# Patient Record
Sex: Female | Born: 1985 | Race: White | Hispanic: No
Health system: Southern US, Community
[De-identification: ages and names within clinical notes are randomized; demographics above are authoritative.]

---
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
## 2005-02-01 ENCOUNTER — Other Ambulatory Visit: Admission: RE | Admit: 2005-02-01 | Discharge: 2005-02-01 | Payer: Self-pay | Admitting: Obstetrics and Gynecology

## 2006-03-26 ENCOUNTER — Ambulatory Visit (HOSPITAL_COMMUNITY): Admission: RE | Admit: 2006-03-26 | Discharge: 2006-03-26 | Payer: Self-pay | Admitting: Ophthalmology

## 2006-03-28 ENCOUNTER — Ambulatory Visit (HOSPITAL_COMMUNITY): Admission: RE | Admit: 2006-03-28 | Discharge: 2006-03-28 | Payer: Self-pay | Admitting: Ophthalmology

## 2006-06-27 ENCOUNTER — Ambulatory Visit: Admission: RE | Admit: 2006-06-27 | Discharge: 2006-06-27 | Payer: Self-pay | Admitting: Ophthalmology

## 2010-05-26 ENCOUNTER — Encounter: Admission: RE | Admit: 2010-05-26 | Discharge: 2010-05-26 | Payer: Self-pay | Admitting: Internal Medicine

## 2010-09-01 ENCOUNTER — Other Ambulatory Visit: Payer: Self-pay | Admitting: Physician Assistant

## 2010-09-01 ENCOUNTER — Other Ambulatory Visit
Admission: RE | Admit: 2010-09-01 | Discharge: 2010-09-01 | Payer: Self-pay | Source: Home / Self Care | Admitting: Internal Medicine

## 2010-10-26 ENCOUNTER — Other Ambulatory Visit: Payer: Self-pay | Admitting: Internal Medicine

## 2010-10-26 DIAGNOSIS — Z09 Encounter for follow-up examination after completed treatment for conditions other than malignant neoplasm: Secondary | ICD-10-CM

## 2010-11-12 ENCOUNTER — Ambulatory Visit
Admission: RE | Admit: 2010-11-12 | Discharge: 2010-11-12 | Disposition: A | Discharge status: Home / Self Care | Payer: Commercial Indemnity | Source: Ambulatory Visit | Attending: Internal Medicine | Admitting: Internal Medicine

## 2010-11-12 DIAGNOSIS — Z09 Encounter for follow-up examination after completed treatment for conditions other than malignant neoplasm: Secondary | ICD-10-CM

## 2010-12-25 NOTE — Op Note (Signed)
NAME:  Nicole [name], Nicole [name]                         ACCOUNT NO.:  [id number]   MEDICAL RECORD NO.:  [id number]                   PATIENT TYPE:  AMB   LOCATION:  DSC                                  FACILITY:  [facility]   PHYSICIAN:  Currie Paris, M.D.           DATE OF BIRTH:  01/14/1986   DATE OF PROCEDURE:  02/24/2004  DATE OF DISCHARGE:                                 OPERATIVE REPORT   OFFICE NUMBER:  #[id number].   PREOPERATIVE DIAGNOSIS:  Left breast mass, lower inner quadrant, probable  fibroadenoma.   POSTOPERATIVE DIAGNOSIS:  Left breast mass, lower inner quadrant, probable  fibroadenoma.   OPERATION:  Removal of left breast mass.   SURGEON:  Currie Paris, M.D.   ANESTHESIA:  General (LMA).   INDICATIONS FOR PROCEDURE:  This patient is a 25 year old who presents with  a small left breast mass which clinically is most likely a fibroadenoma, but  which had a little bit of irregularity to it, and an excision was therefore  recommended.   DESCRIPTION OF PROCEDURE:  The patient is seen in the holding area.  She had  no further questions.  She was taken to the operating room and the mass was  identified and marked.  After satisfactory general anesthesia was given, the  breast was prepped and draped.  I injected 0.25% plain Marcaine around the  incisional area to help with postoperative analgesia.  I made a transverse  incision directly over the mass and divided a little subcutaneous tissue.  I  was able to dissect the mass out a little bit with a hemostat, but then  could not really get it freed up, so I put a suture through it for traction  and used the cautery to excise it.  Once this was completely out, it did  appear to be a fibroadenoma with some irregular knobs, but all grossly at  least a fibroadenoma.  The wound was closed after achieving hemostasis using #3-0 Vicryl and #4-0  Monocryl plus Dermabond.  The patient tolerated the procedure well.  There were no  complications.  All  counts are correct.                                               Currie Paris, M.D.    CJS/MEDQ  D:  02/24/2004  T:  02/24/2004  Job:  [id number]   cc:   Leilani Able, P.A.-C.

## 2010-12-25 NOTE — Op Note (Signed)
Nicole [name], Nicole [name]               ACCOUNT NO.:  [id number]   MEDICAL RECORD NO.:  [id number]          PATIENT TYPE:  AMB   LOCATION:  DFTL                         FACILITY:  [facility]   PHYSICIAN:  Alford Highland. Rankin, M.D.   DATE OF BIRTH:  1986-06-09   DATE OF PROCEDURE:  06/27/2006  DATE OF DISCHARGE:  06/27/2006                               OPERATIVE REPORT   PREOPERATIVE DIAGNOSES:  1. Tractional retinal detachment, left eye, detached, recurrent, late      onset.  2. Proliferative vitreal retinopathy, stage C3, inferonasal,      inferotemporal and into the supertemporal quadrant of the left eye,      macula threatened.   POSTOPERATIVE DIAGNOSES:  1. Tractional retinal detachment, left eye, detached, recurrent, late      onset.  2. Proliferative vitreal retinopathy, stage C3, inferonasal,      inferotemporal and into the supertemporal quadrant of the left eye,      macula threatened.   PROCEDURES:  1. Posterior vitrectomy and membrane peel, starfolds at 3 o'clock, 5      o'clock, and 8-o'clock positions, as well as anterior loop      contracture inferiorly.  2. Focal laser photocoagulation for retinopexy purposes in the bed of      the detachment.  3. Removal of posterior implanted silicone oil, left eye.  4. Injection of temporary Perfluoron, left eye.  5. Injection of vitreous substitute, permanent, silicone oil 5000      centistokes.   SURGEON:  Alford Highland. Rankin, M.D.   ANESTHESIA:  General endotracheal.   INDICATIONS FOR PROCEDURES:  The patient is a 25 year old woman, who has  complications of late  onset of retinal detachment, from a previous  underlying history of retina of prematurity.  The patient has had  previous complex funnel detachment, which underwent surgical repair some  3 months previously.  She has developed a late onset redetachment in the  left eye.  This is an attempt to reattach the retina so as to allow for  visual functioning and preservation of the  vision in the left eye.  The  patient and family understand the risks of anesthesia, including the  rare occurrence of death, but also including but not limited to  hemorrhage, infection, scarring, no change in vision, loss of vision and  progression of disease despite intervention.   DESCRIPTION OF PROCEDURES:  After appropriate signed consent was  obtained, the patient was taken to the operating room.  In the operating  room, general endotracheal anesthetic was instituted without difficulty.  The left periocular region was sterilely prepped and draped in the usual  ophthalmic fashion.  The lid speculum was applied.  Conjunctival  peritomies were then fashioned inferotemporally and superiorly in each  quadrant.  The infusion was secured 3.5 mm posterior to the limbus in  the inferotemporal quadrant.  Placement in the vitreous cavity was  verified visually.  A superior sclerotomy was fashioned.  The microscope  was placed in position with the BIOM attachment.  Silicone oil  extraction was then carried out uneventfully with the extrusion set.  At  this time, the incision was made to put Perfluoron over the posterior  pole to protect the posterior pole so as to allow for dissection of  fibrous contracture inferiorly, and anterior loop contracture, as well  as starfolds at the 3, 5 and 9-o'clock positions.  This was carried out  without difficulty with a combination of scissors, as well as forceps  removal.  The 3 areas were marked for later purposes because of retinal  thinning, and a retinal hole in 1 case which formed.  Foreshortening of  the retina was noted at the [facility] position.  For this reason,  Endodiathermy was also used in this area to cauterize, and then  subsequently to create a retinotomy along the slope of the buckle so as  to allow for excellent mobility of the retina to reattach.   At this time, Perfluoron was added.  Now, a fluid-air exchange was then  carried out  over the Perfluoron.  The retina flattened nicely.  Endolaser photocoagulation was placed anteriorly and within the bed of  Perfluoron for retinopexy purposes.  The retina remained nicely  attached.  Reaspiration of accumulated fluid was obtained after the  Perfluoron was removed.  No posterior migration of subretinal fluid was  noted.  The retina was nicely attached.  At this time, the instruments  were removed from the eye and the supertemporal sclerotomy closed with 7-  0 Vicryl.  The silicone oil was then exchanged passively for the air.  The silicone oil fill was carried out to the posterior segment of the  iris plane.  Excellent oil fill was obtained.  Intraocular pressure was  tested and found to be adequate.  The sclerotomy was closed.  The  infusion was removed and similarly closed with 7-0 Vicryl.  The  conjunctiva was closed with 7-0 Vicryl after irrigation of the  conjunctival surface.   At this time, subconjunctival injections of antibiotics and steroid were  applied.  A sterile patch and a Fox shield were applied.  The patient  tolerated the procedure without complication.      Alford Highland Rankin, M.D.  Electronically Signed     GAR/MEDQ  D:  06/27/2006  T:  06/28/2006  Job:  [id number]

## 2010-12-25 NOTE — Op Note (Signed)
NAME:  Nicole [name], Nicole [name]               ACCOUNT NO.:  [id number]   MEDICAL RECORD NO.:  [id number]          PATIENT TYPE:  AMB   LOCATION:  SDS                          FACILITY:  [facility]   PHYSICIAN:  Jillyn Hidden A. Rankin, M.D.   DATE OF BIRTH:  11-09-1985   DATE OF PROCEDURE:  03/26/2006  DATE OF DISCHARGE:                                 OPERATIVE REPORT   SURGEON:  Jillyn Hidden A. Rankin, M.D.   PREOPERATIVE DIAGNOSES:  1. Rhegmatogenous retinal detachment, left eye.  2. Tractional detachment, left eye.  3. Proliferative vitreoretinopathy - stage D, open funnel.  4. 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  5. Contralateral simultaneous localized retinal detachment, right eye.   POSTOPERATIVE DIAGNOSES:  1. Rhegmatogenous retinal detachment, left eye.  2. Tractional detachment, left eye.  3. Proliferative vitreoretinopathy - stage D, open funnel.  4. 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  5. Contralateral simultaneous localized retinal detachment, right eye.   PROCEDURES:  1. Posterior vitrectomy and membrane peel, left eye, from anterior      vitreous loop traction inferiorly, as well as fibrovascular      proliferative vitreal retinopathy.  2. Endolaser pan photocoagulation with retinopexy __________, left eye, to      repair retinal detachment.  3. Scleral buckle using 287 explant, 360 degrees.  4. Injection of vitreous substitute, temporary Perfluoron.  5. Injection of vitreous substitute, silicone oil 500 centistokes.  6. Pars plana lensectomy, left eye, for permit an anterior loop traction      dissection and fibrous contracture of the anterior hyaloid base.   NOTE:  It must be noted that this surgical case was very difficult and  unusual in its nature and extent, and lengthy in nature for the typical  procedures that were done, requiring extensive dissection.   ANESTHESIA:  General endotracheal anesthesia.   COMPLICATIONS:  None.   BLOOD LOSS:   None.   INDICATIONS FOR PROCEDURE:  The patient is a 25 year old woman, who has  bilateral retinal detachment, localized in the right eye, with a complex  apparently chronic retinal detachment in the left eye, as noted by the  presence of intraretinal cysts and significant proliferative vitreal  retinopathy in the left eye, vitreous hemorrhage in the left eye.  The  patient and the family understand this is an attempt to reattach the retina  so as to allow for some salvaging of the globe, potential for salvaging some  side vision and potential vision in this left eye.  The patient and family  understand the critical and dire nature, and the need for surgical  intervention.  There is no hope for visual acuity recovery without surgical  intervention.  The patient understands the risks of anesthesia, including  risks death and loss of the eye, including but not limited to from the  condition and surgical repair hemorrhage, infection, scarring, need for  further surgery, no change in vision, loss of vision, progression of disease  despite intervention.  After the procedure, the risk of post-retinal  reattachment glaucoma will be discussed.   The patient and  the family understands these risks.  She wishes to proceed.  Appropriate signed consent was obtained.   DESCRIPTION OF PROCEDURE:  Taken to the operating room.  In the operating  room, appropriate monitors, followed general endotracheal anesthesia.  The  left periocular region was sterilely prepped and draped in the usual  ophthalmic fashion.  A lid speculum was applied.  Conjunctival peritomy was  then fashioned for 360 degrees.  A relaxing incision was made.  The rectus  muscle was isolated with 2-0 silk ties.  For 360 degrees, 2 and sometimes 3  rows of retinal cryopexy placed in a transscleral fashion.  At this time, a  buckle was preplaced under each of the rectus muscles, secured temporarily  with 5-0 Mersilene suture.  A 4-mm  infusion was secured 3.5 mm posterior to  the limbus in the inferotemporal quadrant.  Placement in the vitreous cavity  verified visually.  Superior sclerotomy was fashioned.  Microscope placed in  position with the BIOM attached.  Fibrous and fibrovascular scar tissue was  immediately behind the lens, and this required lens removal immediately.  The lens was basically aspirated after the posterior capsule was opened.  Remnants of the peripheral capsule were also removed.  At this time, a  circumcision technique was then used to open the anterior hyaloid, and in a  centripetal fashion, work outwards to release the vitreous base in its  rather dense fibrous, multiple layers of attachment to the anterior and  peripheral retina.  Anterior loop traction was found inferiorly.  Scleral  depression was then used to free this area up.  Early on, Perfluoron was  then used, placed over the posterior pole to stabilize the retina, as well  as to allow for traction to be used in an anterior/posterior fashion so as  to allow scissors delamination of much of the anterior loop traction and  scar tissue.  Vitreous shaving techniques were also used.  Retinal holes  that allowed spontaneous fluid-fluid exchange were noted, 2 in the inferior  quadrant, 1 nasally, and 1 superotemporally.  Fluid-fluid exchange carried  out overlying the Perfluoron, and a thick slurry of fluid was noted.  A  retinal cyst was noted temporally.  The roof of the cyst was opened with an  MVR blade and the fluid was drained, and this was to allow for compaction of  the retina postoperatively.  At this time, additional Perfluoron was applied  to approximately to the equator.  This allowed drainage, a fluid-fluid  exchange, and a break easily.  Thereafter, fluid-air exchange was then  completed overlying the Perfluoron.  It was at this time, endolaser photocoagulation was prepared; however, there was a hookup/connection  difficulty.   The sclerotomies were plugged and attention was drawn to  helping the OR staff with the laser, which did lengthen the case by  approximately 15 minutes.   At this time, attention was again drawn to the eye.  Fluid-air exchange  carried out overlying the Perfluoron.  Laser retinopexy was applied  posterior to the slope of the buckle, as well as over the slope of the  buckle.  It was also deemed necessary to add additional retinal cryopexy  superiorly.  This was carried out gently in the area of the pars plicata  because of some consideration that the pars plicata may have also become  loosened in this area.   At this time, the laser photocoagulation was then completed 360 degrees over  the slope of the buckle,  posterior slope of the buckle, within the bed of  the Perfluoron.  Repeat aspirations of fluid recollection over the  Perfluoron allowed and assured for complete drying of the subretinal space.  This was carried out over a 15 to 25-minute period.  At this time, the  Perfluoron was removed.  No recollection of subretinal fluid posteriorly  occurred.  At this time, the supertemporal sclerotomy closed with 7-0  Vicryl.  The air-Silicone oil exchange carried out passively.  Superior  nasal sclerotomy closed.  Appropriate air bubble was left in the anterior  chamber to maintain its patency.  The infusion was removed, similarly closed  with 7-0 Vicryl.  Conjunctiva was then irrigated with the bug juice on the  bed of the buckle.  Thereafter, the conjunctiva and Tenon's were brought  forward and closed in layers with 7-0 Vicryl sutures.  Subconjunctival  injections of antibiotics were applied.  Approximately 4 cc of Marcaine was  then used in a peribulbar fashion to allow for some elements of  postoperative pain control.   A sterile patch and Fox shield applied.  Patient awakened from anesthesia,  taken to the recovery room in good and stable condition.      Alford Highland Rankin, M.D.   Electronically Signed     GAR/MEDQ  D:  03/26/2006  T:  03/26/2006  Job:  [id number]   cc:   Elpidio Galea, MD

## 2010-12-25 NOTE — Op Note (Signed)
[name], [name]               ACCOUNT NO.:  [id number]   MEDICAL RECORD NO.:  [id number]          PATIENT TYPE:  AMB   LOCATION:  SDS                          FACILITY:  [facility]   PHYSICIAN:  Jillyn Hidden A. Rankin, M.D.   DATE OF BIRTH:  [birth year]   DATE OF PROCEDURE:  03/28/2006  DATE OF DISCHARGE:  03/28/2006                                 OPERATIVE REPORT   PREOPERATIVE DIAGNOSES:  1. Secondary glaucoma, left eye, secondary to #2.  2. Angle closure left eye secondary to #3.  3. Anterior dislocation and anterior forward movement of posterior      vitreous implant/ silicone oil, most likely secondary to ciliary body      congestion and/or possible silicone oil over fill at the time of      primary surgery performed on March 26, 2006.  4. Retained implant/small amount of Perfluoron inferiorly left eye.  5. Silicone oil implant left eye.   POSTOPERATIVE DIAGNOSES:  1. Secondary glaucoma, left eye, secondary to #2.  2. Closure left eye secondary to #3.  3. Anterior dislocation and anterior forward movement of posterior      vitreous implant/silicone oil, most likely secondary to ciliary body      congestion and/or possible silicone oil over fill at the time of      primary surgery performed on March 26, 2006.  4. Retained implant - small amount of Perfluoron inferiorly, left eye.  5. Silicone oil implant left eye.   PROCEDURE:  1. Removal of retained posterior implant/Perfluoron.  2. Partial removal of retained implant/silicone oil left eye.  3. Reformation of the anterior chamber, left eye.  4. Injection of vitreous substitute/anterior chamber to maintain patency      of the anterior chamber.   FINDINGS:  The inferior peripheral iridectomy performed on 03/26/06 is intact  and patent.   SURGEON:  Alford Highland. Rankin, M.D.   ANESTHESIA:  General endotracheal anesthesia.   INDICATIONS:  The patient is a 25 year old woman who recently underwent  complex retinal detachment of the  left eye for complex proliferative vitreal  retinopathy stage D1 open funnel detachment using vitreous substitute  including silicone oil and Perfluoron. The patient had successful  reattachment of the retina that has improved in these 2 days from light  perception preoperatively now to count fingers clearly at 3 feet in the left  eye.   The patient, however, has a small amount of retained Perfluoron inadvertent  but also beneficial because it does flatten the inferior retina nicely.  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  The patient and the family understand this is an intent to partially remove  the Perfluoron implant but also to remove partial amounts of silicone oil  and possibly full exchange of the oil if necessary with vitrectomy.  The  patient understands the risk of anesthesia including the rare occurrence of  death, and also risks to  the eye including but not limited to from the  condition of surgical repair, hemorrhage, infection, scarring, need for  further surgery, no change in vision, loss of vision, progression of disease  despite intervention. Appropriate signed consent was obtained.   DESCRIPTION OF PROCEDURE:  The patient was taken to the operating room.  In  the operator room, appropriate monitors followed by mild sedation.  After  mild sedation, general endotracheal anesthesia was administered without  difficulty.  The left periocular region sterilely prepped and draped in the  usual ophthalmic fashion.  A lid speculum applied.  At this time  conjunctival openings were made in the supranasal, supratemporal quadrant.  Inferotemporal paracentesis incision was made and a 20-gauge Lewicky  anterior chamber maintainer was placed in the anterior chamber and air was  used initially to  help insufflate the eye and also to avoid the placement of  the aqueous medium into the eye.   Superior sclerotomies were then fashioned.  Using light pipe and a blunt-tip  extrusion needle, the Perfluoron was identified posteriorly and was removed  under passive suction techniques without difficulty.  All Perfluoron was  removed in this way underling the still present silicone oil.   At this time the decision was made to close the superotemporal sclerotomy.  At this time a separate paracentesis incision was made.  An attempt was made  to extract through this region some of the silicone oil and this was done  without difficulty with a 20-gauge soft-tip silicone extrusion needle.   This allowed for careful anterior segment monitoring to confirm that the  anterior chamber and the iris in fact did retract.  The iris did  spontaneously retract inferiorly.  Through the supranasal sclerotomy, second  __________ spatula was placed to open the anterior chamber and relieve the  iris apposition. Through a separate paracentesis incision, this was further  done in the supranasal quadrant.  A decision was made to leave air in the  anterior chamber so as to confirm that the iris did not reappose and close  down the angle.   This was maintained throughout the remainder of this portion of the  procedure.  The infusion site had been enlarged to allow for removal of the  silicone oil and this is now closed with interrupted 10-0 nylon suture  inferotemporal.  This had been done after the supranasal sclerotomy had been  closed with 7-0  Vicryl.  Conjunctiva closed with a 7-0  Vicryl.  The  subconjunctival injections of antibiotic and steroid applied.  Sterile patch  and Fox shield applied.  The patient tolerated the procedure without  complications.      Alford Highland Rankin, M.D.  Electronically Signed    GAR/MEDQ  D:  03/28/2006  T:  03/29/2006  Job:  [id number]

## 2011-04-24 ENCOUNTER — Emergency Department (HOSPITAL_COMMUNITY): Payer: Commercial Indemnity

## 2011-04-24 ENCOUNTER — Emergency Department (HOSPITAL_COMMUNITY)
Admission: EM | Admit: 2011-04-24 | Discharge: 2011-04-24 | Disposition: A | Discharge status: Home / Self Care | Payer: Commercial Indemnity | Attending: Emergency Medicine | Admitting: Emergency Medicine

## 2011-04-24 DIAGNOSIS — M79609 Pain in unspecified limb: Secondary | ICD-10-CM | POA: Insufficient documentation

## 2011-04-24 DIAGNOSIS — F411 Generalized anxiety disorder: Secondary | ICD-10-CM | POA: Insufficient documentation

## 2011-04-24 DIAGNOSIS — Z79899 Other long term (current) drug therapy: Secondary | ICD-10-CM | POA: Insufficient documentation

## 2011-04-24 DIAGNOSIS — M542 Cervicalgia: Secondary | ICD-10-CM | POA: Insufficient documentation

## 2011-04-24 DIAGNOSIS — F988 Other specified behavioral and emotional disorders with onset usually occurring in childhood and adolescence: Secondary | ICD-10-CM | POA: Insufficient documentation

## 2011-04-24 DIAGNOSIS — S139XXA Sprain of joints and ligaments of unspecified parts of neck, initial encounter: Secondary | ICD-10-CM | POA: Insufficient documentation

## 2012-01-31 ENCOUNTER — Other Ambulatory Visit: Payer: Self-pay | Admitting: Family Medicine

## 2012-01-31 ENCOUNTER — Other Ambulatory Visit (HOSPITAL_COMMUNITY)
Admission: RE | Admit: 2012-01-31 | Discharge: 2012-01-31 | Disposition: A | Discharge status: Home / Self Care | Payer: Commercial Indemnity | Source: Ambulatory Visit | Attending: Family Medicine | Admitting: Family Medicine

## 2012-01-31 DIAGNOSIS — Z0000 Encounter for general adult medical examination without abnormal findings: Secondary | ICD-10-CM | POA: Insufficient documentation

## 2012-02-02 ENCOUNTER — Other Ambulatory Visit: Payer: Self-pay | Admitting: Family Medicine

## 2012-02-02 DIAGNOSIS — N63 Unspecified lump in unspecified breast: Secondary | ICD-10-CM

## 2012-02-14 ENCOUNTER — Ambulatory Visit
Admission: RE | Admit: 2012-02-14 | Discharge: 2012-02-14 | Disposition: A | Discharge status: Home / Self Care | Payer: Commercial Indemnity | Source: Ambulatory Visit | Attending: Family Medicine | Admitting: Family Medicine

## 2012-02-14 DIAGNOSIS — N63 Unspecified lump in unspecified breast: Secondary | ICD-10-CM
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
## 2018-08-16 ENCOUNTER — Other Ambulatory Visit (HOSPITAL_COMMUNITY)
Admission: RE | Admit: 2018-08-16 | Discharge: 2018-08-16 | Disposition: A | Discharge status: Home / Self Care | Payer: Managed Care, Other (non HMO) | Source: Ambulatory Visit | Attending: Nurse Practitioner | Admitting: Nurse Practitioner

## 2018-08-16 ENCOUNTER — Other Ambulatory Visit: Payer: Self-pay | Admitting: Nurse Practitioner

## 2018-08-16 DIAGNOSIS — Z01419 Encounter for gynecological examination (general) (routine) without abnormal findings: Secondary | ICD-10-CM | POA: Diagnosis not present

## 2018-08-21 LAB — CYTOLOGY - PAP
Adequacy: ABSENT
DIAGNOSIS: NEGATIVE
HPV (WINDOPATH): NOT DETECTED

## 2019-11-03 ENCOUNTER — Ambulatory Visit: Payer: Managed Care, Other (non HMO) | Attending: Internal Medicine

## 2019-11-03 DIAGNOSIS — Z23 Encounter for immunization: Secondary | ICD-10-CM

## 2019-11-03 NOTE — Progress Notes (Signed)
   Covid-19 Vaccination Clinic  Name:  Judeth Gilles    MRN: [id number] DOB: Aug 20, 1985  11/03/2019  Ms. Sarria was observed post Covid-19 immunization for 15 minutes without incident. She was provided with Vaccine Information Sheet and instruction to access the V-Safe system.   Ms. Choma was instructed to call 911 with any severe reactions post vaccine: Marland Kitchen Difficulty breathing  . Swelling of face and throat  . A fast heartbeat  . A bad rash all over body  . Dizziness and weakness   Immunizations Administered    Name Date Dose VIS Date Route   Pfizer COVID-19 Vaccine 11/03/2019  1:21 PM 0.3 mL 07/20/2019 Intramuscular   Manufacturer: [employer], [employer]   Lot: [id number]   NDC: [address]-0

## 2019-11-28 ENCOUNTER — Ambulatory Visit: Payer: Managed Care, Other (non HMO) | Attending: Internal Medicine

## 2019-11-28 DIAGNOSIS — Z23 Encounter for immunization: Secondary | ICD-10-CM

## 2019-11-28 NOTE — Progress Notes (Signed)
   Covid-19 Vaccination Clinic  Name:  Nicole [name]    MRN: [id number] DOB: 17-Dec-1985  11/28/2019  [name] was observed post Covid-19 immunization for 15 minutes without incident. She was provided with Vaccine Information Sheet and instruction to access the V-Safe system.   [name] was instructed to call 911 with any severe reactions post vaccine: Marland Kitchen Difficulty breathing  . Swelling of face and throat  . A fast heartbeat  . A bad rash all over body  . Dizziness and weakness   Immunizations Administered    Name Date Dose VIS Date Route   Pfizer COVID-19 Vaccine 11/28/2019  4:10 PM 0.3 mL 10/03/2018 Intramuscular   Manufacturer: [employer], [employer]   Lot: [id number]   NDC: [address]-9
